# Patient Record
Sex: Female | Born: 1970
Health system: Southern US, Community
[De-identification: ages and names within clinical notes are randomized; demographics above are authoritative.]

---

## 2004-09-01 ENCOUNTER — Emergency Department (HOSPITAL_COMMUNITY): Admission: EM | Admit: 2004-09-01 | Discharge: 2004-09-01 | Payer: Self-pay | Admitting: Emergency Medicine

## 2007-04-20 ENCOUNTER — Emergency Department (HOSPITAL_COMMUNITY): Admission: EM | Admit: 2007-04-20 | Discharge: 2007-04-20 | Payer: Self-pay | Admitting: Emergency Medicine

## 2009-11-09 ENCOUNTER — Emergency Department (HOSPITAL_COMMUNITY): Admission: EM | Admit: 2009-11-09 | Discharge: 2009-11-10 | Payer: Self-pay | Admitting: Emergency Medicine

## 2014-12-25 ENCOUNTER — Emergency Department (HOSPITAL_COMMUNITY)
Admission: EM | Admit: 2014-12-25 | Discharge: 2014-12-26 | Disposition: A | Discharge status: Home / Self Care | Payer: Self-pay | Attending: Emergency Medicine | Admitting: Emergency Medicine

## 2014-12-25 ENCOUNTER — Emergency Department (HOSPITAL_COMMUNITY): Payer: Self-pay

## 2014-12-25 ENCOUNTER — Encounter (HOSPITAL_COMMUNITY): Payer: Self-pay | Admitting: Emergency Medicine

## 2014-12-25 DIAGNOSIS — Z720 Tobacco use: Secondary | ICD-10-CM | POA: Insufficient documentation

## 2014-12-25 DIAGNOSIS — Y92009 Unspecified place in unspecified non-institutional (private) residence as the place of occurrence of the external cause: Secondary | ICD-10-CM | POA: Insufficient documentation

## 2014-12-25 DIAGNOSIS — Y9389 Activity, other specified: Secondary | ICD-10-CM | POA: Insufficient documentation

## 2014-12-25 DIAGNOSIS — W540XXA Bitten by dog, initial encounter: Secondary | ICD-10-CM | POA: Insufficient documentation

## 2014-12-25 DIAGNOSIS — S51832A Puncture wound without foreign body of left forearm, initial encounter: Secondary | ICD-10-CM | POA: Insufficient documentation

## 2014-12-25 DIAGNOSIS — Z23 Encounter for immunization: Secondary | ICD-10-CM | POA: Insufficient documentation

## 2014-12-25 DIAGNOSIS — Y998 Other external cause status: Secondary | ICD-10-CM | POA: Insufficient documentation

## 2014-12-25 NOTE — ED Notes (Signed)
Pt from home c/o dog bite to left forearm and left side. She reports she was trying to break up a fight between her two dogs. Dogs not up to date on vaccinations.

## 2014-12-25 NOTE — ED Notes (Signed)
Pt reports "puppy" bites to left forearm which occurred at 10pm this evening.  Pain is currently rated 10/10 and described as sharp and aching.  She has limited movement in left fourth finger since the injury occurred.  No meds have been taken.  She states that the dogs involved are puppies and have never been vaccinated for rabies, and they alternate between staying indoors and outdoors.  Pt is unsure of her last tetanus shot.

## 2014-12-26 MED ORDER — TETANUS-DIPHTH-ACELL PERTUSSIS 5-2.5-18.5 LF-MCG/0.5 IM SUSP
0.5000 mL | Freq: Once | INTRAMUSCULAR | Status: AC
  Administered 2014-12-26: 0.5 mL via INTRAMUSCULAR
  Filled 2014-12-26: qty 0.5

## 2014-12-26 MED ORDER — LIDOCAINE-EPINEPHRINE (PF) 2 %-1:200000 IJ SOLN
20.0000 mL | Freq: Once | INTRAMUSCULAR | Status: AC
  Administered 2014-12-26: 20 mL via INTRADERMAL
  Filled 2014-12-26: qty 20

## 2014-12-26 MED ORDER — AMOXICILLIN-POT CLAVULANATE 875-125 MG PO TABS: 1.0000 | ORAL_TABLET | Freq: Two times a day (BID) | ORAL | Status: DC

## 2014-12-26 NOTE — ED Provider Notes (Signed)
CSN: [id number]     Arrival date & time 12/25/14  2316 History   This chart was scribed for Mirian Mo, MD by Evon Slack, ED Scribe. This patient was seen in room WA23/WA23 and the patient's care was started at 12:18 AM.      Chief Complaint  Patient presents with  . Animal Bite   Patient is a 44 y.o. female presenting with animal bite. The history is provided by the patient. No language interpreter was used.  Animal Bite Contact animal:  Dog Location:  Shoulder/arm Shoulder/arm injury location:  L forearm Time since incident:  2 hours Pain details:    Severity:  Moderate Incident location:  Home Notifications:  None Animal's rabies vaccination status:  Not immunized Animal in possession: yes   Tetanus status:  Unknown Relieved by:  None tried Ineffective treatments:  None tried Associated symptoms: no fever and no numbness    HPI Comments: Laura Atkinson is a 44 y.o. female who presents to the Emergency Department complaining of dog bite onset 2 hours PTA. Pt states that she was bit by her 2 pit bulls that she was trying to break up from fighting. Pt presents with puncture wounds to the right arm. Pt states she has decreased sensation to the 4th and 5th digits in the right hand. Pt states that her pain is worse with movement. Pt is unsure of her last tetanus. Pt states that her dogs vaccinations are not UTD.    History reviewed. No pertinent past medical history. History reviewed. No pertinent past surgical history. No family history on file. History  Substance Use Topics  . Smoking status: Current Every Day Smoker  . Smokeless tobacco: Not on file  . Alcohol Use: Yes     Comment: social   OB History    No data available     Review of Systems  Constitutional: Negative for fever.  Skin: Positive for wound.  Neurological: Negative for numbness.  All other systems reviewed and are negative.     Allergies  Hydrocodone  Home Medications   Prior to  Admission medications   Medication Sig Start Date End Date Taking? Authorizing Provider  amoxicillin-clavulanate (AUGMENTIN) 875-125 MG per tablet Take 1 tablet by mouth every 12 (twelve) hours. 12/26/14   Mirian Mo, MD   BP 107/54 mmHg  Pulse 91  Temp(Src) 97.7 F (36.5 C) (Oral)  Resp 18  Wt 154 lb (69.854 kg)  SpO2 96%  LMP 11/25/2014   Physical Exam  Constitutional: She is oriented to person, place, and time. She appears well-developed and well-nourished.  HENT:  Head: Normocephalic and atraumatic.  Right Ear: External ear normal.  Left Ear: External ear normal.  Eyes: Conjunctivae and EOM are normal. Pupils are equal, round, and reactive to light.  Neck: Normal range of motion. Neck supple.  Cardiovascular: Normal rate, regular rhythm, normal heart sounds and intact distal pulses.   Pulmonary/Chest: Effort normal and breath sounds normal.  Abdominal: Soft. Bowel sounds are normal. There is no tenderness.  Musculoskeletal: Normal range of motion.  4 cm laceration/puncture to L dorsal forearm with exposed and lacerated muscle belly Smaller 1 cm puncture over ventral forearm Sensation intact distally, ? Decrease in extensor strength of 4-5 digits  Neurological: She is alert and oriented to person, place, and time.  Skin: Skin is warm and dry.  Vitals reviewed.   ED Course  LACERATION REPAIR Date/Time: 12/26/2014 2:15 AM Performed by: Mirian Mo Authorized by: Mirian Mo Consent: Verbal  consent obtained. Foreign bodies: no foreign bodies Vascular damage: no Local anesthetic: lidocaine 1% with epinephrine Anesthetic total: 8 ml Irrigation solution: saline Irrigation method: syringe Amount of cleaning: standard Debridement: none Degree of undermining: none Skin closure: 3-0 Prolene Number of sutures: 1 Technique: simple Approximation: loose Approximation difficulty: simple Patient tolerance: Patient tolerated the procedure well with no immediate  complications   (including critical care time) DIAGNOSTIC STUDIES: Oxygen Saturation is 96% on RA, normal by my interpretation.    COORDINATION OF CARE: 12:28 AM-Discussed treatment plan with pt at bedside and pt agreed to plan.     Labs Review Labs Reviewed - No data to display  Imaging Review Dg Forearm Left  12/26/2014   CLINICAL DATA:  44 year old female with dog bite in the mid forearm  EXAM: LEFT FOREARM - 2 VIEW  COMPARISON:  None.  FINDINGS: There is no acute fracture or dislocation. There is soft tissue laceration of the proximal forearm macro with bite injury. No radiopaque foreign object identified.  IMPRESSION: No acute/ traumatic osseous injury.   Electronically Signed   By: Elgie Collard M.D.   On: 12/26/2014 00:25     EKG Interpretation None      MDM   Final diagnoses:  Dog bite      44 y.o. female without pertinent PMH presents with dog bite to left forearm. Dogs want to the patient and are in custody, not up-to-date on vaccinations. Discussed rabies vaccination including risk of death and patient refused.  Although patient has a laceration, will not close completely and the emergency department due to risk of infection.  One suture placed to close the gaping nature of the wound with large areas of potential drainage to either side of stitch. Will not suture ventral wound. Tetanus updated. Discharged home to follow-up closely with hand fu and placed in a splint.    I have reviewed all laboratory and imaging studies if ordered as above  1. Dog bite           Mirian Mo, MD 12/26/14 [phone]

## 2014-12-26 NOTE — Discharge Instructions (Signed)

## 2014-12-26 NOTE — Progress Notes (Signed)
Orthopedic Tech Progress Note Patient Details:  Laura Atkinson 09-Aug-1970 [id number]  Ortho Devices Type of Ortho Device: Ace wrap, Sugartong splint Ortho Device/Splint Location: LUE Ortho Device/Splint Interventions: Application   Asia R Thompson 12/26/2014, 3:08 AM

## 2019-10-21 ENCOUNTER — Emergency Department (HOSPITAL_COMMUNITY): Payer: Self-pay

## 2019-10-21 ENCOUNTER — Encounter (HOSPITAL_COMMUNITY): Payer: Self-pay | Admitting: Emergency Medicine

## 2019-10-21 ENCOUNTER — Emergency Department (HOSPITAL_COMMUNITY)
Admission: EM | Admit: 2019-10-21 | Discharge: 2019-10-21 | Disposition: A | Discharge status: Home / Self Care | Payer: Self-pay | Attending: Emergency Medicine | Admitting: Emergency Medicine

## 2019-10-21 DIAGNOSIS — R062 Wheezing: Secondary | ICD-10-CM

## 2019-10-21 DIAGNOSIS — R0789 Other chest pain: Secondary | ICD-10-CM | POA: Insufficient documentation

## 2019-10-21 DIAGNOSIS — J45901 Unspecified asthma with (acute) exacerbation: Secondary | ICD-10-CM | POA: Insufficient documentation

## 2019-10-21 DIAGNOSIS — F17211 Nicotine dependence, cigarettes, in remission: Secondary | ICD-10-CM | POA: Insufficient documentation

## 2019-10-21 LAB — CBC
HCT: 46.1 % — ABNORMAL HIGH (ref 36.0–46.0)
Hemoglobin: 15.3 g/dL — ABNORMAL HIGH (ref 12.0–15.0)
MCH: 29.2 pg (ref 26.0–34.0)
MCHC: 33.2 g/dL (ref 30.0–36.0)
MCV: 88 fL (ref 80.0–100.0)
Platelets: 297 10*3/uL (ref 150–400)
RBC: 5.24 MIL/uL — ABNORMAL HIGH (ref 3.87–5.11)
RDW: 12.7 % (ref 11.5–15.5)
WBC: 11.2 10*3/uL — ABNORMAL HIGH (ref 4.0–10.5)
nRBC: 0 % (ref 0.0–0.2)

## 2019-10-21 LAB — BASIC METABOLIC PANEL
Anion gap: 9 (ref 5–15)
BUN: 5 mg/dL — ABNORMAL LOW (ref 6–20)
CO2: 26 mmol/L (ref 22–32)
Calcium: 9.2 mg/dL (ref 8.9–10.3)
Chloride: 103 mmol/L (ref 98–111)
Creatinine, Ser: 0.75 mg/dL (ref 0.44–1.00)
GFR calc Af Amer: >60 mL/min (ref >60)
GFR calc non Af Amer: >60 mL/min (ref >60)
Glucose, Bld: 111 mg/dL — ABNORMAL HIGH (ref 70–99)
Potassium: 4.1 mmol/L (ref 3.5–5.1)
Sodium: 138 mmol/L (ref 135–145)

## 2019-10-21 LAB — TROPONIN I (HIGH SENSITIVITY): Troponin I (High Sensitivity): 4 ng/L (ref <18)

## 2019-10-21 LAB — I-STAT BETA HCG BLOOD, ED (MC, WL, AP ONLY): I-stat hCG, quantitative: <5 m[IU]/mL (ref <5)

## 2019-10-21 MED ORDER — SODIUM CHLORIDE 0.9% FLUSH: 3.0000 mL | Freq: Once | INTRAVENOUS | Status: DC

## 2019-10-21 MED ORDER — AEROCHAMBER PLUS FLO-VU LARGE MISC
Status: AC
  Filled 2019-10-21: qty 1

## 2019-10-21 MED ORDER — ALBUTEROL SULFATE HFA 108 (90 BASE) MCG/ACT IN AERS
6.0000 | INHALATION_SPRAY | Freq: Once | RESPIRATORY_TRACT | Status: AC
  Administered 2019-10-21: 6 via RESPIRATORY_TRACT
  Filled 2019-10-21: qty 6.7

## 2019-10-21 MED ORDER — METHYLPREDNISOLONE 4 MG PO TBPK: ORAL_TABLET | ORAL | 0 refills | Status: AC

## 2019-10-21 MED ORDER — METHYLPREDNISOLONE 4 MG PO TBPK: ORAL_TABLET | ORAL | 0 refills | Status: DC

## 2019-10-21 MED ORDER — ALBUTEROL SULFATE HFA 108 (90 BASE) MCG/ACT IN AERS: 2.0000 | INHALATION_SPRAY | RESPIRATORY_TRACT | 5 refills | Status: AC | PRN

## 2019-10-21 MED ORDER — PREDNISONE 20 MG PO TABS
60.0000 mg | ORAL_TABLET | Freq: Once | ORAL | Status: AC
  Administered 2019-10-21: 60 mg via ORAL
  Filled 2019-10-21: qty 3

## 2019-10-21 NOTE — ED Triage Notes (Signed)
Ongoing sob for a few months, states that for the past few days she has had sob with orthopnea and cough. Denies edema, reports she had a negative covid test last year. Denies chest pain, speaking in full sentences.

## 2019-10-21 NOTE — ED Provider Notes (Signed)
[facility]PART[facility] Provider Note   CSN: [id number] Arrival date & time: 10/21/19  1319     History Chief Complaint  Patient presents with  . Shortness of Breath    Laura Atkinson is a 49 y.o. female with history of smoking presented emergency department with shortness of breath and cough.  The patient reports has been having symptoms for several months but really worsening in the past month.  She describes persistent coughing, worse with exertion.  She is she walks her dogs after 15 minutes she has bent over coughing.  She also has persistent coughing at night and has a difficult time sleeping for the past month.  She did she was a former smoker for 20 years but quit a month ago due to all the coughing she has been having.  She denies any fevers or chills.  She is a nonproductive cough.  She does describe a pressure sensation in the left side of her chest for the past month with all of her coughing fits.  It is worse with coughing.  She also has nausea sometimes with heavy coughing.  No hemoptysis or asymmetric LE edema. Patient denies personal or family history of DVT or PE. No recent hormone use (including OCP); travel for >6 hours; prolonged immobilization for greater than 3 days; surgeries or trauma in the last 4 weeks; or malignancy with treatment within 6 months.  Fam hx sig for MI in mother at age 65.  No other medical problems NKDA  HPI     History reviewed. No pertinent past medical history.  There are no problems to display for this patient.   History reviewed. No pertinent surgical history.   OB History   No obstetric history on file.     No family history on file.  Social History   Tobacco Use  . Smoking status: Current Every Day Smoker  Substance Use Topics  . Alcohol use: Yes    Comment: social  . Drug use: Not on file    Home Medications Prior to Admission medications   Medication Sig Start Date End Date  Taking? Authorizing Provider  albuterol (VENTOLIN HFA) 108 (90 Base) MCG/ACT inhaler Inhale 2 puffs into the lungs every 4 (four) hours as needed for wheezing or shortness of breath. 10/21/19   Wyvonnia Dusky, MD  methylPREDNISolone (MEDROL DOSEPAK) 4 MG TBPK tablet Use as directed 10/22/19   Wyvonnia Dusky, MD    Allergies    Hydrocodone  Review of Systems   Review of Systems  Constitutional: Negative for chills and fever.  HENT: Positive for congestion and sinus pressure.   Eyes: Negative for photophobia and visual disturbance.  Respiratory: Positive for chest tightness and shortness of breath.   Cardiovascular: Negative for palpitations and leg swelling.  Gastrointestinal: Negative for abdominal pain and vomiting.  Genitourinary: Negative for dysuria and hematuria.  Musculoskeletal: Negative for back pain and joint swelling.  Skin: Negative for color change and rash.  Neurological: Negative for syncope and headaches.  Psychiatric/Behavioral: Negative for agitation and confusion.  All other systems reviewed and are negative.   Physical Exam Updated Vital Signs BP (!) 117/95 (BP Location: Left Arm)   Pulse 96   Temp 97.9 F (36.6 C) (Oral)   Resp 18   SpO2 95%   Physical Exam Vitals and nursing note reviewed.  Constitutional:      General: She is not in acute distress.    Appearance: She is well-developed.  HENT:     Head: Normocephalic and atraumatic.  Eyes:     Conjunctiva/sclera: Conjunctivae normal.  Cardiovascular:     Rate and Rhythm: Normal rate and regular rhythm.     Heart sounds: No murmur.  Pulmonary:     Effort: Pulmonary effort is normal. No respiratory distress.     Breath sounds: Normal breath sounds.     Comments: Bilateral wheezing expiratory Speaking in full sentences Abdominal:     Palpations: Abdomen is soft.     Tenderness: There is no abdominal tenderness.  Musculoskeletal:     Cervical back: Neck supple.  Skin:    General: Skin is  warm and dry.  Neurological:     Mental Status: She is alert.     ED Results / Procedures / Treatments   Labs (all labs ordered are listed, but only abnormal results are displayed) Labs Reviewed  BASIC METABOLIC PANEL - Abnormal; Notable for the following components:      Result Value   Glucose, Bld 111 (*)    BUN 5 (*)    All other components within normal limits  CBC - Abnormal; Notable for the following components:   WBC 11.2 (*)    RBC 5.24 (*)    Hemoglobin 15.3 (*)    HCT 46.1 (*)    All other components within normal limits  I-STAT BETA HCG BLOOD, ED ([facility], [facility], [facility] ONLY)  TROPONIN I (HIGH SENSITIVITY)    EKG EKG Interpretation  Date/Time:  Sunday Oct 21 2019 14:15:26 EDT Ventricular Rate:  81 PR Interval:  138 QRS Duration: 84 QT Interval:  392 QTC Calculation: 455 R Axis:   41 Text Interpretation: Normal sinus rhythm Low voltage QRS Borderline ECG No STEMI Confirmed by Alvester Chou [phone]) on 10/21/2019 4:12:37 PM   Radiology DG Chest 2 View  Result Date: 10/21/2019 CLINICAL DATA:  Three months of shortness of breath EXAM: CHEST - 2 VIEW COMPARISON:  None FINDINGS: The cardiomediastinal contours and hilar structures are normal. Lungs are clear. No sign of pleural effusion. Visualized skeletal structures are unremarkable. IMPRESSION: No active cardiopulmonary disease. Electronically Signed   By: Donzetta Kohut M.D.   On: 10/21/2019 15:07    Procedures Procedures (including critical care time)  Medications Ordered in ED Medications  sodium chloride flush (NS) 0.9 % injection 3 mL (3 mLs Intravenous Not Given 10/21/19 1833)  AeroChamber Plus Flo-Vu Large MISC (has no administration in time range)  albuterol (VENTOLIN HFA) 108 (90 Base) MCG/ACT inhaler 6 puff (6 puffs Inhalation Given 10/21/19 1718)  predniSONE (DELTASONE) tablet 60 mg (60 mg Oral Given 10/21/19 1851)    ED Course  I have reviewed the triage vital signs and the nursing notes.  Pertinent labs  & imaging results that were available during my care of the patient were reviewed by me and considered in my medical decision making (see chart for details).  49 yo female here with SOB, dypsnea for the past month.  Reporting coughing at night, coughing with exercise.  She is a heavy smoker, stopped 1 month ago due to these symptoms.    Differential is broad for the symptoms.  This includes new diagnosis of reactive airway disease versus atypical coronary syndrome versus pneumonia versus viral illness versus other.  Her EKG is nonischemic.  Her delta tropes flat.  Lower suspicion for acute coronary syndrome.  She is PERC negative and has no red flags for PE.  Suspect this is asthma/reactive airway with her wheezing.  We  gave albuterol inhaler puffs and steroids here.  She was breathing better and her coughing improved, but her wheezing was MORE pronounced, likely 2/2 asthma.  I think is reasonable to treat her with steroids and an asthma inhaler at home.  Discussed follow-up and establishing care in her wellness clinic for recheck.  I will place a consult to our transition of care team to assist with this.  We will also offer her a spacer prior to discharge.  Clinically does not appear to be symptomatic anemia or congestive heart failure per my exam.  Low suspicion for PNA at this time.   Clinical Course as of Oct 20 2117  Wynelle Link Oct 21, 2019  1850 Feeling better after albuterol.  Now having more prominent wheezing on exam.  Suspect this may be reactive airway disease.  We discussed starting steroids and using albuterol at home.  I'll place a wellness clinic referral to expedite f/u   [MT]    Clinical Course User Index [MT] Trifan, Kermit Balo, MD   Final Clinical Impression(s) / ED Diagnoses Final diagnoses:  Exacerbation of asthma, unspecified asthma severity, unspecified whether persistent  Wheezing    Rx / DC Orders ED Discharge Orders         Ordered    methylPREDNISolone (MEDROL  DOSEPAK) 4 MG TBPK tablet     10/21/19 1856    albuterol (VENTOLIN HFA) 108 (90 Base) MCG/ACT inhaler  Every 4 hours PRN     10/21/19 1854    methylPREDNISolone (MEDROL DOSEPAK) 4 MG TBPK tablet  Status:  Discontinued     10/21/19 1854           Terald Sleeper, MD 10/21/19 2127

## 2019-10-21 NOTE — Discharge Instructions (Signed)
Our case manager should reach out to you in 24-48 hours to help get you set up with a new primary care visit in our wellness clinic.    I think you may be feeling short of breath from asthma.  You were having a lot of wheezing on exam.  Your wheezing and breathing got better after albuterol.  I prescribed albuterol pump to use at home.  For the next 2 days, I want you to take 2 puffs every 4 hours.  After that you can take 2 puffs when you are feeling short of breath or having symptoms.  Do not use this medicine more than once every 4 hours (2 puffs).  Use it with the spacer that was provided.  It is very important that you follow-up and establish care for management of this condition.  I also prescribed a course of steroids.  Please use these as directed please finish the full course.  This may also help with the inflammation in your lungs.  Finally, it is very important that you stop smoking.  Smoking is an extreme irritant of your lungs.

## 2019-10-22 ENCOUNTER — Telehealth: Payer: Self-pay | Admitting: *Deleted

## 2019-10-22 MED FILL — METHYLPREDNISOLONE 4 MG TBP: 4 | 6 days supply | Qty: 21 | Fill #0

## 2019-10-22 NOTE — Telephone Encounter (Signed)
Lynell Kussman J. Lucretia Roers, RN, BSN, [place] [phone]  RNCM set up appointment with Cammie Fulp on 6/3 @ 2:30.  Let a voicemail and advised to please arrive 15 min early and take a picture ID and your current medications.

## 2019-11-08 ENCOUNTER — Ambulatory Visit: Payer: Self-pay | Admitting: Family Medicine

## 2019-11-27 ENCOUNTER — Ambulatory Visit: Payer: Self-pay | Admitting: Nurse Practitioner

## 2020-01-04 ENCOUNTER — Other Ambulatory Visit: Payer: Self-pay

## 2020-01-04 ENCOUNTER — Ambulatory Visit: Payer: Self-pay | Attending: Nurse Practitioner | Admitting: Nurse Practitioner

## 2020-01-29 ENCOUNTER — Ambulatory Visit: Payer: Self-pay | Admitting: Critical Care Medicine

## 2020-01-29 NOTE — Progress Notes (Deleted)
   Subjective:    Patient ID: Laura Atkinson, female    DOB: Sep 14, 1970, 49 y.o.   MRN: [id number]  49 y.o. F   PCP to establish Hx of moderate persistent Asthma with ED Visit 10/21/19     Review of Systems     Objective:   Physical Exam        Assessment & Plan:

## 2020-12-07 IMAGING — DX DG CHEST 2V
2 series · 2 of 2 positions shown · non-contrast
Comparison: None

CLINICAL DATA: Three months of shortness of breath

EXAM:
CHEST - 2 VIEW

[chest pa]
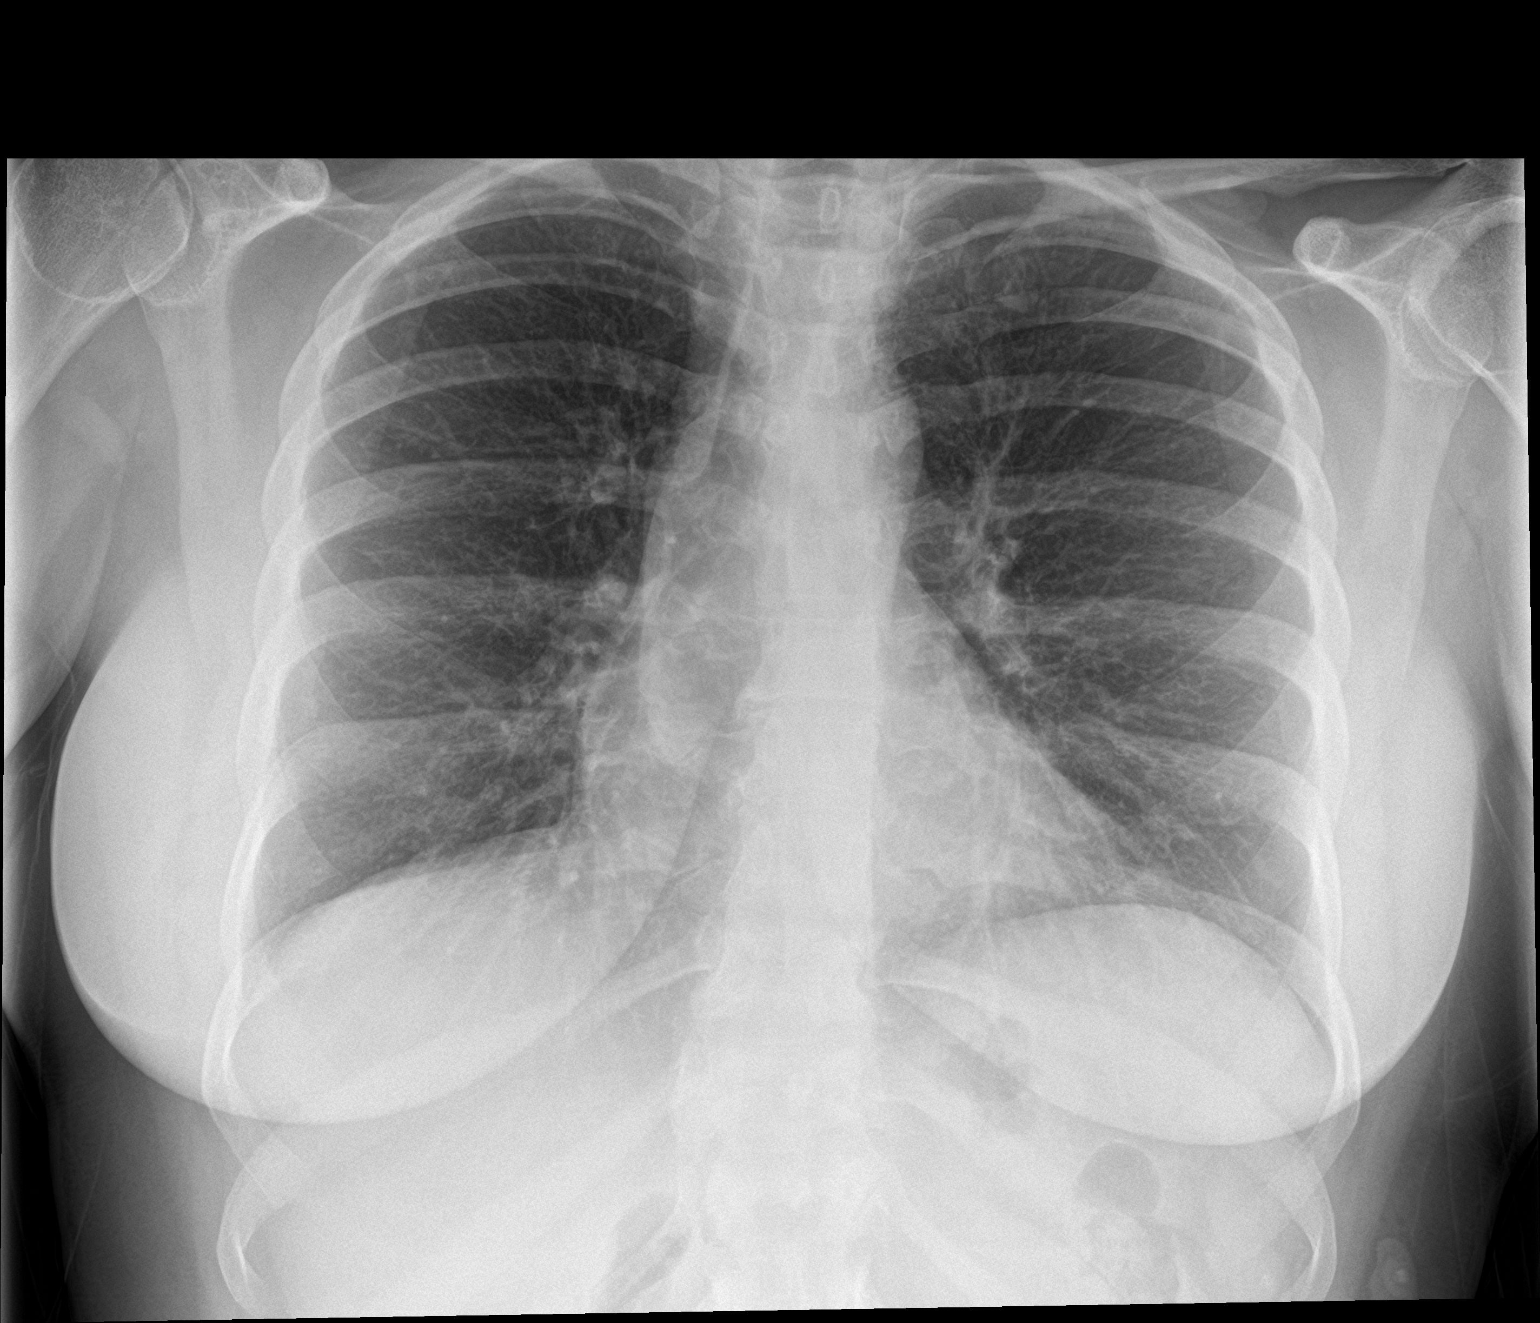

[chest lat]
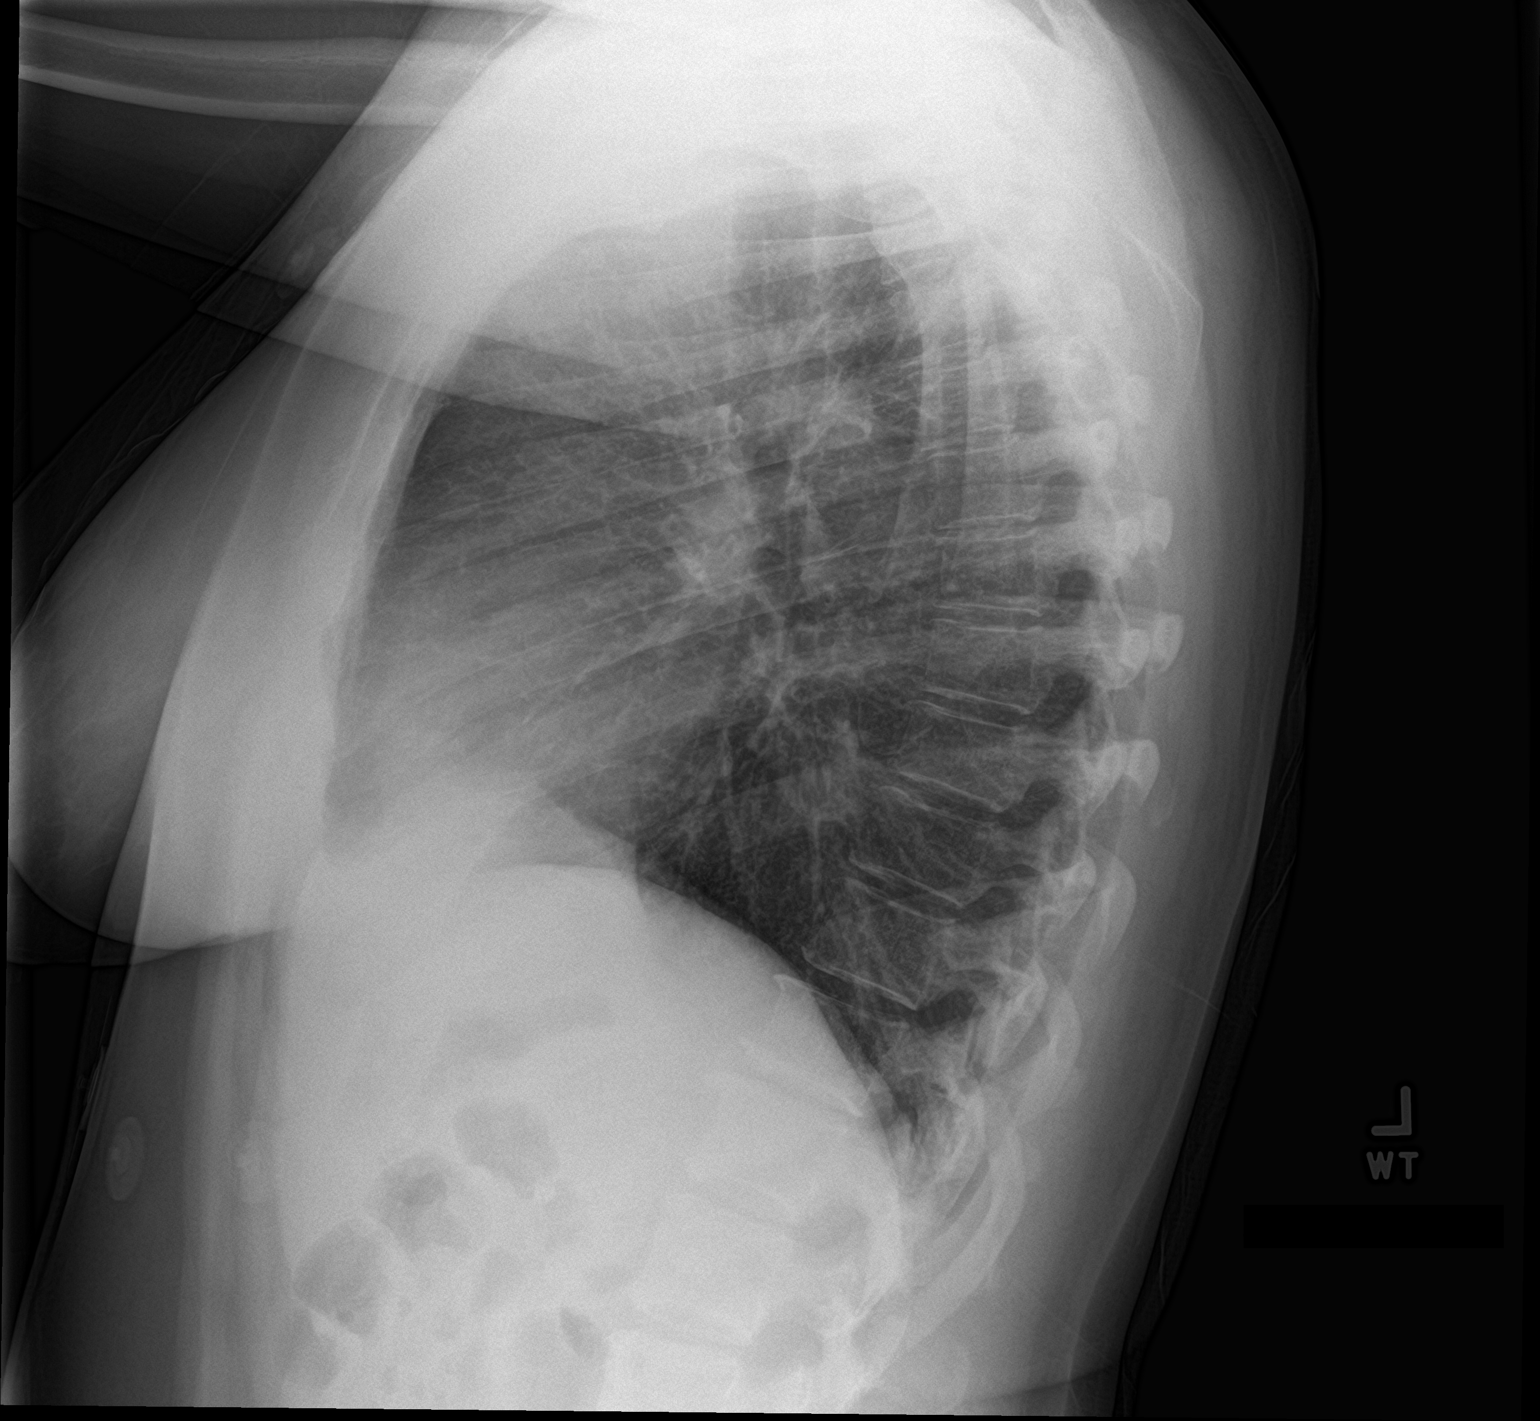

[2 of 2 positions shown; findings below may reference images not displayed]

FINDINGS: The cardiomediastinal contours and hilar structures are normal.

Lungs are clear.

No sign of pleural effusion.

Visualized skeletal structures are unremarkable.
IMPRESSION: No active cardiopulmonary disease.

## 2022-01-04 ENCOUNTER — Ambulatory Visit
Admission: EM | Admit: 2022-01-04 | Discharge: 2022-01-04 | Disposition: A | Discharge status: Home / Self Care | Payer: Self-pay | Attending: Physician Assistant | Admitting: Physician Assistant

## 2022-01-04 DIAGNOSIS — J45901 Unspecified asthma with (acute) exacerbation: Secondary | ICD-10-CM

## 2022-01-04 MED ORDER — PREDNISONE 20 MG PO TABS: 40.0000 mg | ORAL_TABLET | Freq: Every day | ORAL | 0 refills | Status: AC

## 2022-01-04 MED ORDER — IPRATROPIUM-ALBUTEROL 0.5-2.5 (3) MG/3ML IN SOLN
3.0000 mL | Freq: Once | RESPIRATORY_TRACT | Status: AC
  Administered 2022-01-04: 3 mL via RESPIRATORY_TRACT

## 2022-01-04 NOTE — ED Provider Notes (Signed)
[facility]    CSN: [id number] Arrival date & time: 01/04/22  1514      History   Chief Complaint Chief Complaint  Patient presents with   Asthma    HPI Laura Atkinson is a 51 y.o. female.   Patient here today for evaluation of possible asthma exacerbation.  She reports that over the last week she has had increasingly worsening shortness of breath and wheezing as well as cough.  She reports that she was diagnosed with asthma after having COVID, and that typically symptoms are kept at bay however last week was more challenging.  She has tried using her albuterol inhaler without significant relief.  She has been taking Benadryl which has not been helpful.  The history is provided by the patient.    History reviewed. No pertinent past medical history.  There are no problems to display for this patient.   History reviewed. No pertinent surgical history.  OB History   No obstetric history on file.      Home Medications    Prior to Admission medications   Medication Sig Start Date End Date Taking? Authorizing Provider  predniSONE (DELTASONE) 20 MG tablet Take 2 tablets (40 mg total) by mouth daily with breakfast for 5 days. 01/04/22 01/09/22 Yes Tomi Bamberger, PA-C  albuterol (VENTOLIN HFA) 108 (90 Base) MCG/ACT inhaler Inhale 2 puffs into the lungs every 4 (four) hours as needed for wheezing or shortness of breath. 10/21/19   Terald Sleeper, MD  methylPREDNISolone (MEDROL DOSEPAK) 4 MG TBPK tablet Use as directed 10/22/19   Terald Sleeper, MD    Family History History reviewed. No pertinent family history.  Social History Social History   Tobacco Use   Smoking status: Every Day  Substance Use Topics   Alcohol use: Yes    Comment: social     Allergies   Hydrocodone   Review of Systems Review of Systems  Constitutional:  Negative for chills and fever.  HENT:  Negative for congestion and rhinorrhea.   Eyes:  Negative for discharge and  redness.  Respiratory:  Positive for chest tightness, shortness of breath and wheezing.   Gastrointestinal:  Negative for abdominal pain, nausea and vomiting.     Physical Exam Triage Vital Signs ED Triage Vitals  Enc Vitals Group     BP      Pulse      Resp      Temp      Temp src      SpO2      Weight      Height      Head Circumference      Peak Flow      Pain Score      Pain Loc      Pain Edu?      Excl. in GC?    No data found.  Updated Vital Signs BP 132/78   Pulse 100   Temp 98.7 F (37.1 C)   Resp 18   SpO2 (!) 88%       Physical Exam Vitals and nursing note reviewed.  Constitutional:      General: She is not in acute distress.    Appearance: Normal appearance. She is not ill-appearing.  HENT:     Head: Normocephalic and atraumatic.  Eyes:     Conjunctiva/sclera: Conjunctivae normal.  Cardiovascular:     Rate and Rhythm: Normal rate and regular rhythm.     Heart sounds: Normal heart sounds.  No murmur heard. Pulmonary:     Effort: Pulmonary effort is normal. No respiratory distress.     Breath sounds: Normal breath sounds. No wheezing (audible wheezing resolved after neb treatment), rhonchi or rales.  Neurological:     Mental Status: She is alert.  Psychiatric:        Mood and Affect: Mood normal.        Behavior: Behavior normal.        Thought Content: Thought content normal.      UC Treatments / Results  Labs (all labs ordered are listed, but only abnormal results are displayed) Labs Reviewed - No data to display  EKG   Radiology No results found.  Procedures Procedures (including critical care time)  Medications Ordered in UC Medications  ipratropium-albuterol (DUONEB) 0.5-2.5 (3) MG/3ML nebulizer solution 3 mL (3 mLs Nebulization Given 01/04/22 1538)    Initial Impression / Assessment and Plan / UC Course  I have reviewed the triage vital signs and the nursing notes.  Pertinent labs & imaging results that were available  during my care of the patient were reviewed by me and considered in my medical decision making (see chart for details).    Patient with suspected asthma exacerbation. Will treat with oral steroids and encouraged follow up with PCP. Recommend follow up at urgent care or ED with any further concerns.   Final Clinical Impressions(s) / UC Diagnoses   Final diagnoses:  Asthma with acute exacerbation, unspecified asthma severity, unspecified whether persistent   Discharge Instructions   None    ED Prescriptions     Medication Sig Dispense Auth. Provider   predniSONE (DELTASONE) 20 MG tablet Take 2 tablets (40 mg total) by mouth daily with breakfast for 5 days. 10 tablet Tomi Bamberger, PA-C      PDMP not reviewed this encounter.   Tomi Bamberger, PA-C 01/04/22 1721

## 2022-01-04 NOTE — ED Triage Notes (Signed)
Pt presents to [facility] with co of asthma exacerbation with no saba. Pt st she was recently diagnosed with asthma following covid and was given albuterol which she said did not help and she has stopped using. Pt has only taken benadryl as needed
